# Patient Record
Sex: Female | Born: 2005 | Race: Black or African American | Hispanic: No | Marital: Single | Smoking: Never smoker
Health system: Southern US, Community
[De-identification: ages and names within clinical notes are randomized; demographics above are authoritative.]

## PROBLEM LIST (undated history)

## (undated) DIAGNOSIS — J45909 Unspecified asthma, uncomplicated: Secondary | ICD-10-CM

---
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
## 2024-01-12 ENCOUNTER — Emergency Department
Admission: EM | Admit: 2024-01-12 | Discharge: 2024-01-12 | Disposition: A | Discharge status: Home / Self Care | Attending: Emergency Medicine | Admitting: Emergency Medicine

## 2024-01-12 ENCOUNTER — Other Ambulatory Visit: Payer: Self-pay

## 2024-01-12 DIAGNOSIS — L02411 Cutaneous abscess of right axilla: Secondary | ICD-10-CM | POA: Insufficient documentation

## 2024-01-12 DIAGNOSIS — Z7689 Persons encountering health services in other specified circumstances: Secondary | ICD-10-CM

## 2024-01-12 DIAGNOSIS — L0291 Cutaneous abscess, unspecified: Secondary | ICD-10-CM

## 2024-01-12 DIAGNOSIS — J45909 Unspecified asthma, uncomplicated: Secondary | ICD-10-CM | POA: Diagnosis not present

## 2024-01-12 LAB — CBC WITH DIFFERENTIAL/PLATELET
Abs Immature Granulocytes: 0.04 K/uL (ref 0.00–0.07)
Basophils Absolute: 0 K/uL (ref 0.0–0.1)
Basophils Relative: 0 %
Eosinophils Absolute: 0.1 K/uL (ref 0.0–0.5)
Eosinophils Relative: 0 %
HCT: 35 % — ABNORMAL LOW (ref 36.0–46.0)
Hemoglobin: 12.1 g/dL (ref 12.0–15.0)
Immature Granulocytes: 0 %
Lymphocytes Relative: 12 %
Lymphs Abs: 1.7 K/uL (ref 0.7–4.0)
MCH: 30.6 pg (ref 26.0–34.0)
MCHC: 34.6 g/dL (ref 30.0–36.0)
MCV: 88.6 fL (ref 80.0–100.0)
Monocytes Absolute: 1.2 K/uL — ABNORMAL HIGH (ref 0.1–1.0)
Monocytes Relative: 9 %
Neutro Abs: 10.7 K/uL — ABNORMAL HIGH (ref 1.7–7.7)
Neutrophils Relative %: 79 %
Platelets: 270 K/uL (ref 150–400)
RBC: 3.95 MIL/uL (ref 3.87–5.11)
RDW: 12.7 % (ref 11.5–15.5)
WBC: 13.8 K/uL — ABNORMAL HIGH (ref 4.0–10.5)
nRBC: 0 % (ref 0.0–0.2)

## 2024-01-12 LAB — BASIC METABOLIC PANEL WITH GFR
Anion gap: 13 (ref 5–15)
BUN: 8 mg/dL (ref 6–20)
CO2: 20 mmol/L — ABNORMAL LOW (ref 22–32)
Calcium: 9.2 mg/dL (ref 8.9–10.3)
Chloride: 106 mmol/L (ref 98–111)
Creatinine, Ser: 0.85 mg/dL (ref 0.44–1.00)
GFR, Estimated: >60 mL/min
Glucose, Bld: 95 mg/dL (ref 70–99)
Potassium: 3.8 mmol/L (ref 3.5–5.1)
Sodium: 139 mmol/L (ref 135–145)

## 2024-01-12 MED ORDER — MORPHINE SULFATE (PF) 4 MG/ML IV SOLN
4.0000 mg | Freq: Once | INTRAVENOUS | Status: AC
  Administered 2024-01-12: 4 mg via INTRAVENOUS
  Filled 2024-01-12: qty 1

## 2024-01-12 MED ORDER — ONDANSETRON HCL 4 MG/2ML IJ SOLN
4.0000 mg | Freq: Once | INTRAMUSCULAR | Status: AC
  Administered 2024-01-12: 4 mg via INTRAVENOUS
  Filled 2024-01-12: qty 2

## 2024-01-12 MED ORDER — LIDOCAINE-PRILOCAINE 2.5-2.5 % EX CREA
TOPICAL_CREAM | Freq: Once | CUTANEOUS | Status: AC
  Filled 2024-01-12: qty 5

## 2024-01-12 MED ORDER — LIDOCAINE HCL (PF) 1 % IJ SOLN
5.0000 mL | Freq: Once | INTRAMUSCULAR | Status: AC
  Administered 2024-01-12 (×2): 5 mL via INTRADERMAL
  Filled 2024-01-12: qty 5

## 2024-01-12 MED ORDER — LIDOCAINE-PRILOCAINE 2.5-2.5 % EX CREA
1.0000 | TOPICAL_CREAM | CUTANEOUS | Status: DC
  Administered 2024-01-12: 1

## 2024-01-12 MED ORDER — DOXYCYCLINE HYCLATE 100 MG PO TABS: 100.0000 mg | ORAL_TABLET | Freq: Two times a day (BID) | ORAL | 0 refills | Status: AC

## 2024-01-12 MED ORDER — OXYCODONE-ACETAMINOPHEN 5-325 MG PO TABS: 1.0000 | ORAL_TABLET | ORAL | 0 refills | Status: AC | PRN

## 2024-01-12 NOTE — ED Provider Notes (Signed)
 "  [facility] Provider Note    Event Date/Time   First MD Initiated Contact with Patient 01/12/24 [phone]     (approximate)   History   Abscess   HPI  Veronica Lindsey is a 18 y.o. female history of asthma presents emergency department with right axillary abscess for 4 days.  Patient states very painful.  No fever.  Severe pain at the site.        Physical Exam   Triage Vital Signs: ED Triage Vitals  Encounter Vitals Group     BP 01/12/24 0930 114/85     Girls Systolic BP Percentile --      Girls Diastolic BP Percentile --      Boys Systolic BP Percentile --      Boys Diastolic BP Percentile --      Pulse Rate 01/12/24 0930 (!) 122     Resp 01/12/24 0930 16     Temp 01/12/24 0930 98.9 F (37.2 C)     Temp Source 01/12/24 0930 Oral     SpO2 01/12/24 0930 97 %     Weight 01/12/24 0929 218 lb 14.7 oz (99.3 kg)     Height --      Head Circumference --      Peak Flow --      Pain Score 01/12/24 0928 10     Pain Loc --      Pain Education --      Exclude from Growth Chart --     Most recent vital signs: Vitals:   01/12/24 0930  BP: 114/85  Pulse: (!) 122  Resp: 16  Temp: 98.9 F (37.2 C)  SpO2: 97%     General: Awake, no distress.   CV:  Good peripheral perfusion. Resp:  Normal effort.  Abd:  No distention.   Other:  Patient tearful, right axilla with severely swollen area, area is very tender to palpation, redness does not spread across into the chest.   ED Results / Procedures / Treatments   Labs (all labs ordered are listed, but only abnormal results are displayed) Labs Reviewed  CBC WITH DIFFERENTIAL/PLATELET - Abnormal; Notable for the following components:      Result Value   WBC 13.8 (*)    HCT 35.0 (*)    Neutro Abs 10.7 (*)    Monocytes Absolute 1.2 (*)    All other components within normal limits  BASIC METABOLIC PANEL WITH GFR - Abnormal; Notable for the following components:   CO2 20 (*)    All other components  within normal limits     EKG     RADIOLOGY     PROCEDURES:   .Incision and Drainage  Date/Time: 01/12/2024 11:26 AM  Performed by: Gasper Devere ORN, PA-C Authorized by: Gasper Devere ORN, PA-C   Consent:    Consent obtained:  Verbal   Consent given by:  Patient   Risks, benefits, and alternatives were discussed: yes     Risks discussed:  Bleeding, incomplete drainage, pain, infection and damage to other organs   Alternatives discussed:  No treatment Universal protocol:    Procedure explained and questions answered to patient or proxy's satisfaction: yes     Immediately prior to procedure, a time out was called: yes     Patient identity confirmed:  Verbally with patient Location:    Type:  Abscess   Location:  Upper extremity Pre-procedure details:    Skin preparation:  Povidone-iodine Sedation:    Sedation  type:  None Anesthesia:    Anesthesia method:  Topical application and local infiltration   Topical anesthetic:  1 Application lidocaine -prilocaine  2.5-2.5 %   Local anesthetic:  5 mL lidocaine  (XYLOCAINE ) injection 1 % (PF) Procedure type:    Complexity:  Complex Procedure details:    Ultrasound guidance: no     Needle aspiration: no     Incision types:  Stab incision   Wound management:  Probed and deloculated and irrigated with saline   Drainage:  Purulent   Drainage amount:  Copious   Wound treatment:  Drain placed   Packing materials:  1/4 in iodoform gauze Post-procedure details:    Procedure completion:  Tolerated well, no immediate complications Comments:     Right axilla abscess   Critical Care:   Chief Complaint  Patient presents with   Abscess      MEDICATIONS ORDERED IN ED: Medications  morphine  (PF) 4 MG/ML injection 4 mg (4 mg Intravenous Given 01/12/24 1041)  ondansetron  (ZOFRAN ) injection 4 mg (4 mg Intravenous Given 01/12/24 1041)  lidocaine -prilocaine  (EMLA ) cream ( Topical Given 01/12/24 1041)  lidocaine  (PF) (XYLOCAINE ) 1 %  injection 5 mL (5 mLs Intradermal Given by Other 01/12/24 1126)     IMPRESSION / MDM / ASSESSMENT AND PLAN / ED COURSE  I reviewed the triage vital signs and the nursing notes.                              Differential diagnosis includes, but is not limited to, abscess, cellulitis, at bedtime  Patient's presentation is most consistent with acute illness / injury with system symptoms.   Medications given: Morphine  4 mg IV, Zofran  4 mg IV, Emla  cream, lidocaine   Basic labs, IV start   See procedure note for incision and drainage  Patient tolerated procedure well.  Will place her on doxycycline , pain medication.  Drain was placed so she will need to return in 2 days for wound recheck.  She is in agreement treatment plan.  Discharged stable condition.   FINAL CLINICAL IMPRESSION(S) / ED DIAGNOSES   Final diagnoses:  Abscess  Encounter for incision and drainage procedure     Rx / DC Orders   ED Discharge Orders          Ordered    doxycycline  (VIBRA -TABS) 100 MG tablet  2 times daily        01/12/24 1129    oxyCODONE -acetaminophen  (PERCOCET) 5-325 MG tablet  Every 4 hours PRN        01/12/24 1129             Note:  This document was prepared using Dragon voice recognition software and may include unintentional dictation errors.    Gasper Devere ORN, PA-C 01/12/24 1129    Viviann Pastor, MD 01/12/24 1455  "

## 2024-01-12 NOTE — ED Triage Notes (Signed)
Right axilla abscess x 4 days

## 2024-01-12 NOTE — Discharge Instructions (Addendum)
 Follow-up with your regular doctor as needed.  Return emergency department 2 days for packing removal.  The area may need to be repacked so I would advise to take a pain medication before he comes to the emergency department.  If you need to change the bandage a maxi pad works wonderful in this area.  Return emergency department if you are worsening
